# Patient Record
Sex: Male | Born: 1985 | Race: White | Hispanic: No | Marital: Single | Smoking: Never smoker
Health system: Southern US, Community
[De-identification: ages and names within clinical notes are randomized; demographics above are authoritative.]

---
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
## 2010-01-09 ENCOUNTER — Encounter: Admission: RE | Admit: 2010-01-09 | Discharge: 2010-01-09 | Payer: Self-pay | Admitting: Occupational Medicine

## 2014-04-05 ENCOUNTER — Encounter (HOSPITAL_COMMUNITY): Payer: Self-pay | Admitting: Emergency Medicine

## 2014-04-05 ENCOUNTER — Emergency Department (HOSPITAL_COMMUNITY): Payer: Worker's Compensation

## 2014-04-05 ENCOUNTER — Emergency Department (HOSPITAL_COMMUNITY)
Admission: EM | Admit: 2014-04-05 | Discharge: 2014-04-05 | Disposition: A | Discharge status: Home / Self Care | Payer: Worker's Compensation | Attending: Emergency Medicine | Admitting: Emergency Medicine

## 2014-04-05 DIAGNOSIS — Y9389 Activity, other specified: Secondary | ICD-10-CM | POA: Insufficient documentation

## 2014-04-05 DIAGNOSIS — Y9289 Other specified places as the place of occurrence of the external cause: Secondary | ICD-10-CM | POA: Insufficient documentation

## 2014-04-05 DIAGNOSIS — W230XXA Caught, crushed, jammed, or pinched between moving objects, initial encounter: Secondary | ICD-10-CM | POA: Insufficient documentation

## 2014-04-05 DIAGNOSIS — S62309A Unspecified fracture of unspecified metacarpal bone, initial encounter for closed fracture: Secondary | ICD-10-CM | POA: Diagnosis not present

## 2014-04-05 DIAGNOSIS — S62202A Unspecified fracture of first metacarpal bone, left hand, initial encounter for closed fracture: Secondary | ICD-10-CM

## 2014-04-05 DIAGNOSIS — S6990XA Unspecified injury of unspecified wrist, hand and finger(s), initial encounter: Secondary | ICD-10-CM | POA: Insufficient documentation

## 2014-04-05 MED ORDER — HYDROCODONE-ACETAMINOPHEN 5-325 MG PO TABS: 1.0000 | ORAL_TABLET | Freq: Four times a day (QID) | ORAL | Status: DC | PRN

## 2014-04-05 MED ORDER — HYDROCODONE-ACETAMINOPHEN 5-325 MG PO TABS
2.0000 | ORAL_TABLET | Freq: Once | ORAL | Status: AC
  Administered 2014-04-05: 2 via ORAL
  Filled 2014-04-05: qty 2

## 2014-04-05 NOTE — ED Notes (Addendum)
Pt presents to department for evaluation of L hand injury. States he was moving beer kegs and accidentally dropped one on L hand. CMS intact. Capillary refill less than 2 seconds. 3/10 pain upon arrival to ED. No obvious deformities noted.

## 2014-04-05 NOTE — Progress Notes (Signed)
Orthopedic Tech Progress Note Patient Details:  Larry Pierce 1986/02/06 [id number]  Ortho Devices Type of Ortho Device: Ace wrap;Thumb spica splint Ortho Device/Splint Location: lle Ortho Device/Splint Interventions: Application   Myrella Fahs 04/05/2014, 1:35 PM

## 2014-04-05 NOTE — ED Notes (Signed)
Ortho tech at bedside 

## 2014-04-05 NOTE — ED Notes (Signed)
Ortho tech notified.  

## 2014-04-05 NOTE — ED Notes (Signed)
Left hand crushed between two beer kegs. Swelling noted to base left thumb and hand. N-V intact.

## 2014-04-05 NOTE — ED Provider Notes (Signed)
CSN: [id number]     Arrival date & time 04/05/14  1139 History  This chart was scribed for non-physician practitioner, Roxy Horseman, PA-C working with Vanetta Mulders, MD by Greggory Stallion, ED scribe. This patient was seen in room TR05C/TR05C and the patient's care was started at 12:10 PM.     Chief Complaint  Patient presents with  . Hand Pain   The history is provided by the patient. No language interpreter was used.   HPI Comments: Larry Pierce is a 28 y.o. male who presents to the Emergency Department complaining of left hand injury that occurred about one hour ago. States he was moving beer kegs and accidentally smashed his thumb between two of them. Reports sudden onset pain that he rates 2/10 with associated swelling. Certain movements of his hand worsen the pain. Pt has not taken anything for his symptoms but has iced with little relief.   History reviewed. No pertinent past medical history. History reviewed. No pertinent past surgical history. No family history on file. History  Substance Use Topics  . Smoking status: Never Smoker   . Smokeless tobacco: Not on file  . Alcohol Use: Yes     Comment: social    Review of Systems  Constitutional: Negative for fever.  HENT: Negative for congestion.   Eyes: Negative for redness.  Respiratory: Negative for shortness of breath.   Cardiovascular: Negative for chest pain.  Gastrointestinal: Negative for abdominal distention.  Musculoskeletal: Positive for arthralgias and joint swelling.  Skin: Negative for rash.  Neurological: Negative for speech difficulty.  Psychiatric/Behavioral: Negative for confusion.   Allergies  Review of patient's allergies indicates no known allergies.  Home Medications   Prior to Admission medications   Not on File   BP 156/99  Pulse 89  Temp(Src) 98.2 F (36.8 C) (Oral)  Resp 18  SpO2 95%  Physical Exam  Nursing note and vitals reviewed. Constitutional: He is oriented to person,  place, and time. He appears well-developed. No distress.  HENT:  Head: Normocephalic and atraumatic.  Eyes: Conjunctivae and EOM are normal.  Cardiovascular: Normal rate, regular rhythm and intact distal pulses.   Brisk capillary refill.  Pulmonary/Chest: Effort normal. No stridor. No respiratory distress.  Abdominal: He exhibits no distension.  Musculoskeletal: He exhibits tenderness.  Left hand moderately swollen and tender to palpation at the left thumb and MCP. ROM of thumb reduced secondary to pain. No bony abnormality.  Neurological: He is alert and oriented to person, place, and time.  Sensation intact.  Skin: Skin is warm and dry.  Psychiatric: He has a normal mood and affect.    ED Course  Procedures (including critical care time)  DIAGNOSTIC STUDIES: Oxygen Saturation is 95% on RA, adequate by my interpretation.    COORDINATION OF CARE: 12:11 PM-Discussed treatment plan which includes xray with pt at bedside and pt agreed to plan.   Labs Review Labs Reviewed - No data to display  Imaging Review Dg Hand Complete Left  04/05/2014   CLINICAL DATA:  Fall.  EXAM: LEFT HAND - COMPLETE 3+ VIEW  COMPARISON:  None.  FINDINGS: Nondisplaced fractures are noted the base of the left first metacarpal. The fracture may extend into the first carpometacarpal joint space. No other associated abnormalities identified. No displaced fractures identified. No radiopaque foreign body.  IMPRESSION: Nondisplaced fractures at the base of the left first metacarpal. Extension of the fracture to the first carpometacarpal joint space cannot be excluded. a   Nurse, mental health  ByMaisie Fus  Register   On: 04/05/2014 12:29     EKG Interpretation None     SPLINT APPLICATION Date/Time: 4:50 PM Authorized by: Roxy Horseman Consent: Verbal consent obtained. Risks and benefits: risks, benefits and alternatives were discussed Consent given by: patient Splint applied by: orthopedic  technician Location details: left wrist/thumb spica Splint type: thumb spica/volar Supplies used: fiberglass, ace wrap Post-procedure: The splinted body part was neurovascularly unchanged following the procedure. Patient tolerance: Patient tolerated the procedure well with no immediate complications.    MDM   Final diagnoses:  First metacarpal bone fracture, left, closed, initial encounter   Patient with fracture of first metacarpal. Patient placed in thumb spica splint. Recommend hand followup. Patient discussed with Dr. Deretha Emory, who agrees with the plan.  I personally performed the services described in this documentation, which was scribed in my presence. The recorded information has been reviewed and is accurate.  Roxy Horseman, PA-C 04/05/14 1651

## 2014-04-05 NOTE — Discharge Instructions (Signed)
Thumb Fracture  °There are many types of thumb fractures (breaks). There are different ways of treating these fractures, all of which may be correct, varying from case to case. Your caregiver will discuss different ways to treat these fractures with you. °TREATMENT  °· Immobilization. This means the fracture is casted as it is without changing the positions of the fracture (bone pieces) involved. This fracture is casted in a "thumb spica" also called a hitchhiker cast. It is generally left on for 2 to 6 weeks. °· Closed reduction. The bones are manipulated back into position without using surgery. °· ORIF (open reduction and internal fixation). The fracture site is opened and the bone pieces are fixed into place with some type of hardware such as screws or wires. °Your caregiver will discuss the type of fracture you have and the treatment that will be best for that problem. If surgery is the treatment of choice, the following is information for you to know and to let your caregiver know about prior to surgery. °LET YOUR CAREGIVERS KNOW ABOUT: °· Allergies. °· Medications taken including herbs, eye drops, over the counter medications, and creams. °· Use of steroids (by mouth or creams). °· Previous problems with anesthetics or Novocain. °· Family history of anesthetic complications.. °· Possibility of pregnancy, if this applies. °· History of blood clots (thrombophlebitis). °· History of bleeding or blood problems. °· Previous surgery. °· Other health problems. °AFTER THE PROCEDURE  °After surgery, you will be taken to the recovery area. A nurse will watch and check your progress. Once you are awake, stable, and taking fluids well, barring other problems you will be allowed to go home. Once home, an ice pack applied to your operative site may help with discomfort and keep the swelling down. Elevate your hand above your heart as much as possible for the first 4-5 days after the injury/surgery. °HOME CARE INSTRUCTIONS    °· Follow your caregiver's instructions as to activities, exercises, physical therapy, and driving a car. °· Use thumb and exercise as directed. °· Only take over-the-counter or prescription medicines for pain, discomfort, or fever as directed by your caregiver. Do not take aspirin until your caregiver instructs. This can increase bleeding immediately following surgery. °SEEK MEDICAL CARE IF:  °· There is increased bleeding (more than a small spot) from the wound or from beneath your cast or splint. °· There is redness, swelling, or increasing pain in the wound or from beneath your cast or splint. °· You have pus coming from wound or from beneath your cast or splint. °· An unexplained oral temperature above 102° F (38.9° C) develops. °· There is a foul smell coming from the wound or dressing or from beneath your cast or splint. °SEEK IMMEDIATE MEDICAL CARE IF:  °· You develop severe pain, decreased sensation such as numbness or tingling. °· You develop a rash. °· You have difficulty breathing. °· Youhave any allergic problems. °If you do not have a window in your cast for observing the wound, a discharge or minor bleeding may show up as a stain on the outside of your cast. Report these findings to your caregiver. If you have a removable splint overlying the surgical dressings it is common to see a small amount of bleeding. Change the dressings as instructed by your caregiver. °Document Released: 04/12/2003 Document Revised: 10/06/2011 Document Reviewed: 09/16/2013 °Exit[employer]® Patient Information ©2015 [employer]. This information is not intended to replace advice given to you by your health care provider. Make sure   you discuss any questions you have with your health care provider. ° °

## 2014-04-06 NOTE — ED Provider Notes (Signed)
Medical screening examination/treatment/procedure(s) were performed by non-physician practitioner and as supervising physician I was immediately available for consultation/collaboration.   EKG Interpretation None        Thaddeaus Monica, MD 04/06/14 0740 

## 2016-02-16 IMAGING — CR DG HAND COMPLETE 3+V*L*
3 series · 3 of 3 positions shown · non-contrast
Comparison: None.

CLINICAL DATA: Fall.

EXAM:
LEFT HAND - COMPLETE 3+ VIEW

[x hand pa left]
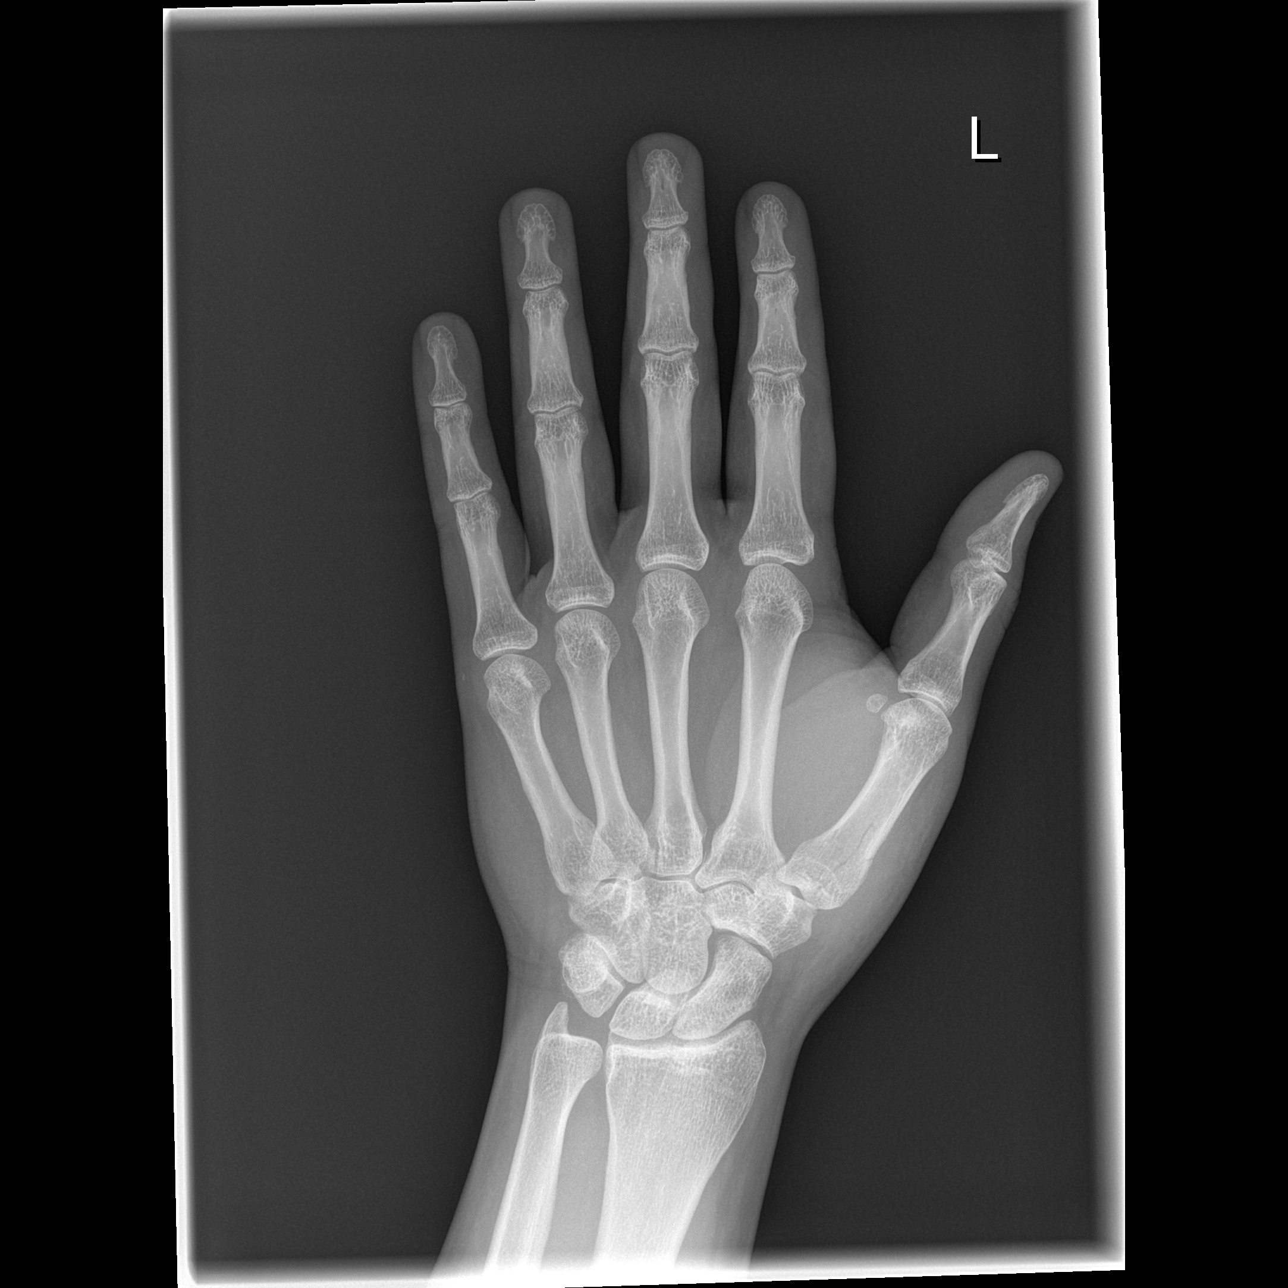

[x hand oblique left]
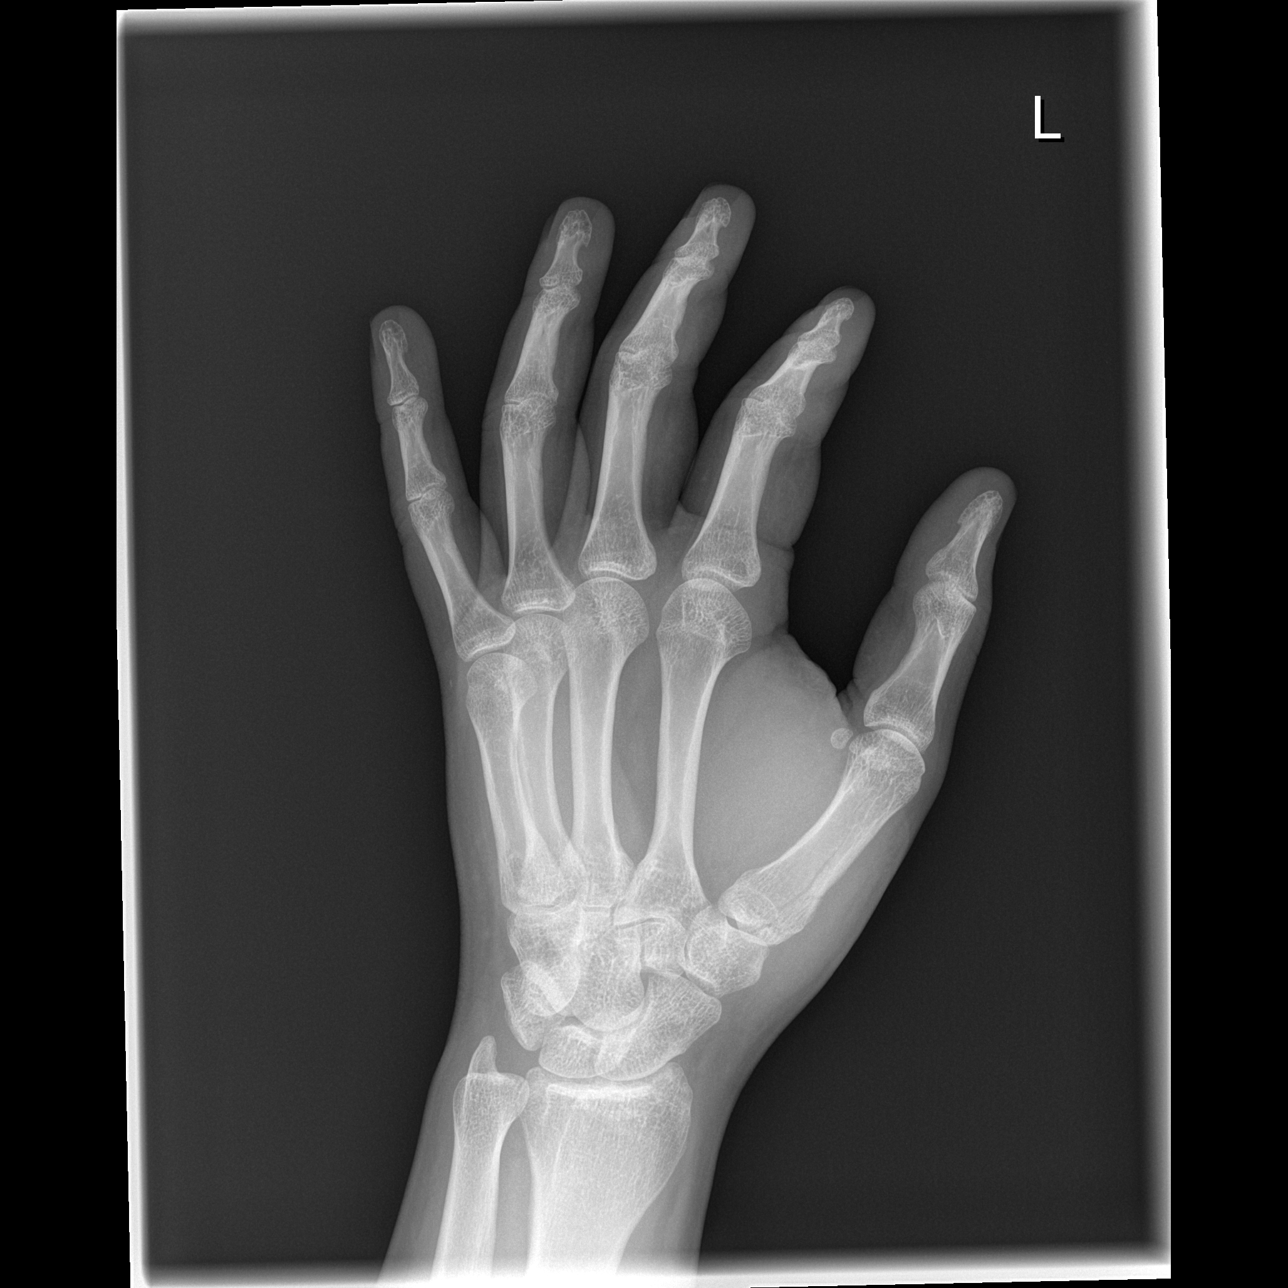

[x hand lat left]
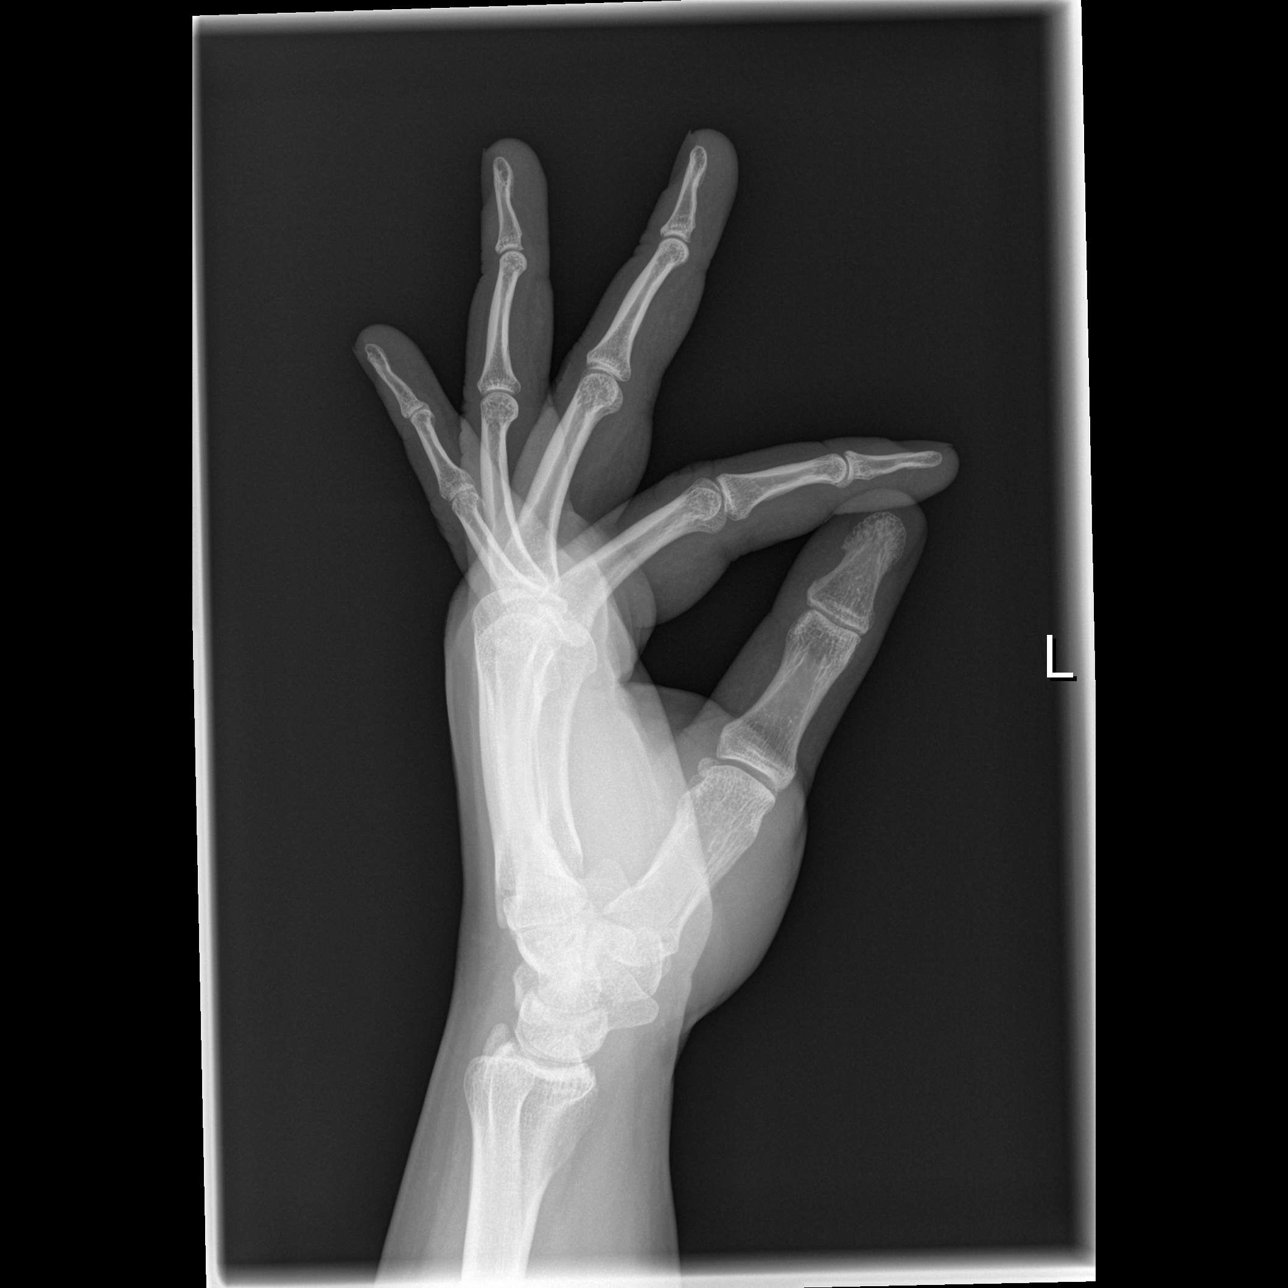

[3 of 3 positions shown; findings below may reference images not displayed]

FINDINGS: Nondisplaced fractures are noted the base of the left first
metacarpal. The fracture may extend into the first carpometacarpal
joint space. No other associated abnormalities identified. No
displaced fractures identified. No radiopaque foreign body.
IMPRESSION: Nondisplaced fractures at the base of the left first metacarpal.
Extension of the fracture to the first carpometacarpal joint space
cannot be excluded. a

## 2017-01-07 ENCOUNTER — Ambulatory Visit: Payer: Self-pay | Admitting: Family Medicine

## 2018-07-29 DIAGNOSIS — Z0000 Encounter for general adult medical examination without abnormal findings: Secondary | ICD-10-CM | POA: Diagnosis not present

## 2018-07-29 DIAGNOSIS — E7800 Pure hypercholesterolemia, unspecified: Secondary | ICD-10-CM | POA: Diagnosis not present

## 2019-03-02 DIAGNOSIS — Z20828 Contact with and (suspected) exposure to other viral communicable diseases: Secondary | ICD-10-CM | POA: Diagnosis not present

## 2019-08-05 DIAGNOSIS — Z0000 Encounter for general adult medical examination without abnormal findings: Secondary | ICD-10-CM | POA: Diagnosis not present

## 2019-08-08 DIAGNOSIS — Z8249 Family history of ischemic heart disease and other diseases of the circulatory system: Secondary | ICD-10-CM | POA: Diagnosis not present

## 2019-08-08 DIAGNOSIS — E7800 Pure hypercholesterolemia, unspecified: Secondary | ICD-10-CM | POA: Diagnosis not present

## 2019-08-08 DIAGNOSIS — Z0000 Encounter for general adult medical examination without abnormal findings: Secondary | ICD-10-CM | POA: Diagnosis not present

## 2019-10-13 ENCOUNTER — Ambulatory Visit: Payer: No Typology Code available for payment source | Attending: Family

## 2019-10-13 DIAGNOSIS — Z23 Encounter for immunization: Secondary | ICD-10-CM

## 2019-10-13 NOTE — Progress Notes (Signed)
   Covid-19 Vaccination Clinic  Name:  Larry Pierce    MRN: [id number] DOB: [birth year]  10/13/2019  Larry Pierce was observed post Covid-19 immunization for 15 minutes without incident. He was provided with Vaccine Information Sheet and instruction to access the V-Safe system.   Larry Pierce was instructed to call 911 with any severe reactions post vaccine: Marland Kitchen Difficulty breathing  . Swelling of face and throat  . A fast heartbeat  . A bad rash all over body  . Dizziness and weakness   Immunizations Administered    Name Date Dose VIS Date Route   Moderna COVID-19 Vaccine 10/13/2019  3:38 PM 0.5 mL 06/28/2019 Intramuscular   Manufacturer: Moderna   Lot: [id number]   NDC: 80223-361-22

## 2019-11-15 ENCOUNTER — Ambulatory Visit: Payer: No Typology Code available for payment source | Attending: Family

## 2019-11-15 DIAGNOSIS — Z23 Encounter for immunization: Secondary | ICD-10-CM

## 2019-11-15 NOTE — Progress Notes (Signed)
   Covid-19 Vaccination Clinic  Name:  Larry Pierce    MRN: [id number] DOB: 11-Oct-1985  11/15/2019  [name] was observed post Covid-19 immunization for 15 minutes without incident. He was provided with Vaccine Information Sheet and instruction to access the V-Safe system.   [name] was instructed to call 911 with any severe reactions post vaccine: Marland Kitchen Difficulty breathing  . Swelling of face and throat  . A fast heartbeat  . A bad rash all over body  . Dizziness and weakness   Immunizations Administered    Name Date Dose VIS Date Route   Moderna COVID-19 Vaccine 11/15/2019  3:42 PM 0.5 mL 06/2019 Intramuscular   Manufacturer: Moderna   Lot: [id number]   NDC: 72158-727-61

## 2021-12-02 ENCOUNTER — Encounter: Payer: Self-pay | Admitting: Emergency Medicine

## 2021-12-02 ENCOUNTER — Ambulatory Visit (INDEPENDENT_AMBULATORY_CARE_PROVIDER_SITE_OTHER): Payer: 59 | Admitting: Emergency Medicine

## 2021-12-02 VITALS — BP 116/84 | HR 103 | Temp 98.4°F | Ht 74.0 in | Wt 280.1 lb

## 2021-12-02 DIAGNOSIS — Z23 Encounter for immunization: Secondary | ICD-10-CM

## 2021-12-02 DIAGNOSIS — Z13220 Encounter for screening for lipoid disorders: Secondary | ICD-10-CM | POA: Diagnosis not present

## 2021-12-02 DIAGNOSIS — Z13228 Encounter for screening for other metabolic disorders: Secondary | ICD-10-CM

## 2021-12-02 DIAGNOSIS — Z1329 Encounter for screening for other suspected endocrine disorder: Secondary | ICD-10-CM

## 2021-12-02 DIAGNOSIS — Z130 Encounter for screening for diseases of the blood and blood-forming organs and certain disorders involving the immune mechanism: Secondary | ICD-10-CM | POA: Diagnosis not present

## 2021-12-02 DIAGNOSIS — Z1159 Encounter for screening for other viral diseases: Secondary | ICD-10-CM | POA: Diagnosis not present

## 2021-12-02 DIAGNOSIS — Z0000 Encounter for general adult medical examination without abnormal findings: Secondary | ICD-10-CM

## 2021-12-02 DIAGNOSIS — R69 Illness, unspecified: Secondary | ICD-10-CM | POA: Diagnosis not present

## 2021-12-02 DIAGNOSIS — Z114 Encounter for screening for human immunodeficiency virus [HIV]: Secondary | ICD-10-CM

## 2021-12-02 LAB — COMPREHENSIVE METABOLIC PANEL
ALT: 29 U/L (ref 0–53)
AST: 21 U/L (ref 0–37)
Albumin: 4.4 g/dL (ref 3.5–5.2)
Alkaline Phosphatase: 84 U/L (ref 39–117)
BUN: 12 mg/dL (ref 6–23)
CO2: 29 mEq/L (ref 19–32)
Calcium: 9.2 mg/dL (ref 8.4–10.5)
Chloride: 105 mEq/L (ref 96–112)
Creatinine, Ser: 1.13 mg/dL (ref 0.40–1.50)
GFR: 83.75 mL/min (ref >60.00)
Glucose, Bld: 100 mg/dL — ABNORMAL HIGH (ref 70–99)
Potassium: 4.3 mEq/L (ref 3.5–5.1)
Sodium: 143 mEq/L (ref 135–145)
Total Bilirubin: 0.4 mg/dL (ref 0.2–1.2)
Total Protein: 7.3 g/dL (ref 6.0–8.3)

## 2021-12-02 LAB — HEMOGLOBIN A1C: Hgb A1c MFr Bld: 5.7 % (ref 4.6–6.5)

## 2021-12-02 LAB — LIPID PANEL
Cholesterol: 181 mg/dL (ref 0–200)
HDL: 39.9 mg/dL (ref >39.00)
LDL Cholesterol: 108 mg/dL — ABNORMAL HIGH (ref 0–99)
NonHDL: 141.3
Total CHOL/HDL Ratio: 5
Triglycerides: 169 mg/dL — ABNORMAL HIGH (ref 0.0–149.0)
VLDL: 33.8 mg/dL (ref 0.0–40.0)

## 2021-12-02 LAB — CBC WITH DIFFERENTIAL/PLATELET
Basophils Absolute: 0.1 10*3/uL (ref 0.0–0.1)
Basophils Relative: 0.6 % (ref 0.0–3.0)
Eosinophils Absolute: 0.1 10*3/uL (ref 0.0–0.7)
Eosinophils Relative: 1.5 % (ref 0.0–5.0)
HCT: 44.1 % (ref 39.0–52.0)
Hemoglobin: 14.8 g/dL (ref 13.0–17.0)
Lymphocytes Relative: 23.5 % (ref 12.0–46.0)
Lymphs Abs: 2.1 10*3/uL (ref 0.7–4.0)
MCHC: 33.7 g/dL (ref 30.0–36.0)
MCV: 86.8 fl (ref 78.0–100.0)
Monocytes Absolute: 0.7 10*3/uL (ref 0.1–1.0)
Monocytes Relative: 7.4 % (ref 3.0–12.0)
Neutro Abs: 6.1 10*3/uL (ref 1.4–7.7)
Neutrophils Relative %: 67 % (ref 43.0–77.0)
Platelets: 245 10*3/uL (ref 150.0–400.0)
RBC: 5.07 Mil/uL (ref 4.22–5.81)
RDW: 14.2 % (ref 11.5–15.5)
WBC: 9.1 10*3/uL (ref 4.0–10.5)

## 2021-12-02 NOTE — Patient Instructions (Signed)
Health Maintenance, Male Adopting a healthy lifestyle and getting preventive care are important in promoting health and wellness. Ask your health care provider about: The right schedule for you to have regular tests and exams. Things you can do on your own to prevent diseases and keep yourself healthy. What should I know about diet, weight, and exercise? Eat a healthy diet  Eat a diet that includes plenty of vegetables, fruits, low-fat dairy products, and lean protein. Do not eat a lot of foods that are high in solid fats, added sugars, or sodium. Maintain a healthy weight Body mass index (BMI) is a measurement that can be used to identify possible weight problems. It estimates body fat based on height and weight. Your health care provider can help determine your BMI and help you achieve or maintain a healthy weight. Get regular exercise Get regular exercise. This is one of the most important things you can do for your health. Most adults should: Exercise for at least 150 minutes each week. The exercise should increase your heart rate and make you sweat (moderate-intensity exercise). Do strengthening exercises at least twice a week. This is in addition to the moderate-intensity exercise. Spend less time sitting. Even light physical activity can be beneficial. Watch cholesterol and blood lipids Have your blood tested for lipids and cholesterol at 36 years of age, then have this test every 5 years. You may need to have your cholesterol levels checked more often if: Your lipid or cholesterol levels are high. You are older than 36 years of age. You are at high risk for heart disease. What should I know about cancer screening? Many types of cancers can be detected early and may often be prevented. Depending on your health history and family history, you may need to have cancer screening at various ages. This may include screening for: Colorectal cancer. Prostate cancer. Skin cancer. Lung  cancer. What should I know about heart disease, diabetes, and high blood pressure? Blood pressure and heart disease High blood pressure causes heart disease and increases the risk of stroke. This is more likely to develop in people who have high blood pressure readings or are overweight. Talk with your health care provider about your target blood pressure readings. Have your blood pressure checked: Every 3-5 years if you are 18-39 years of age. Every year if you are 40 years old or older. If you are between the ages of 65 and 75 and are a current or former smoker, ask your health care provider if you should have a one-time screening for abdominal aortic aneurysm (AAA). Diabetes Have regular diabetes screenings. This checks your fasting blood sugar level. Have the screening done: Once every three years after age 45 if you are at a normal weight and have a low risk for diabetes. More often and at a younger age if you are overweight or have a high risk for diabetes. What should I know about preventing infection? Hepatitis B If you have a higher risk for hepatitis B, you should be screened for this virus. Talk with your health care provider to find out if you are at risk for hepatitis B infection. Hepatitis C Blood testing is recommended for: Everyone born from 1945 through 1965. Anyone with known risk factors for hepatitis C. Sexually transmitted infections (STIs) You should be screened each year for STIs, including gonorrhea and chlamydia, if: You are sexually active and are younger than 36 years of age. You are older than 36 years of age and your   health care provider tells you that you are at risk for this type of infection. Your sexual activity has changed since you were last screened, and you are at increased risk for chlamydia or gonorrhea. Ask your health care provider if you are at risk. Ask your health care provider about whether you are at high risk for HIV. Your health care provider  may recommend a prescription medicine to help prevent HIV infection. If you choose to take medicine to prevent HIV, you should first get tested for HIV. You should then be tested every 3 months for as long as you are taking the medicine. Follow these instructions at home: Alcohol use Do not drink alcohol if your health care provider tells you not to drink. If you drink alcohol: Limit how much you have to 0-2 drinks a day. Know how much alcohol is in your drink. In the [place] one drink equals one 12 oz bottle of beer (355 mL), one 5 oz glass of wine (148 mL), or one 1 oz glass of hard liquor (44 mL). Lifestyle Do not use any products that contain nicotine or tobacco. These products include cigarettes, chewing tobacco, and vaping devices, such as e-cigarettes. If you need help quitting, ask your health care provider. Do not use street drugs. Do not share needles. Ask your health care provider for help if you need support or information about quitting drugs. General instructions Schedule regular health, dental, and eye exams. Stay current with your vaccines. Tell your health care provider if: You often feel depressed. You have ever been abused or do not feel safe at home. Summary Adopting a healthy lifestyle and getting preventive care are important in promoting health and wellness. Follow your health care provider's instructions about healthy diet, exercising, and getting tested or screened for diseases. Follow your health care provider's instructions on monitoring your cholesterol and blood pressure. This information is not intended to replace advice given to you by your health care provider. Make sure you discuss any questions you have with your health care provider. Document Revised: 12/03/2020 Document Reviewed: 12/03/2020 Elsevier Patient Education  2023 [employer]  

## 2021-12-02 NOTE — Progress Notes (Signed)
Larry Pierce 36 y.o.   Chief Complaint  Patient presents with   New Patient (Initial Visit)    No concerns    HISTORY OF PRESENT ILLNESS: This is a 36 y.o. male first visit to this office, here to establish care. Healthy male with a healthy lifestyle. Has no complaints or medical concerns today.  HPI   Prior to Admission medications   Not on File    No Known Allergies  There are no problems to display for this patient.   No past medical history on file.  No past surgical history on file.  Social History   Socioeconomic History   Marital status: Single    Spouse name: Not on file   Number of children: Not on file   Years of education: Not on file   Highest education level: Not on file  Occupational History   Not on file  Tobacco Use   Smoking status: Never   Smokeless tobacco: Not on file  Substance and Sexual Activity   Alcohol use: Yes    Comment: social   Drug use: No   Sexual activity: Not on file  Other Topics Concern   Not on file  Social History Narrative   Not on file   Social Determinants of Health   Financial Resource Strain: Not on file  Food Insecurity: Not on file  Transportation Needs: Not on file  Physical Activity: Not on file  Stress: Not on file  Social Connections: Not on file  Intimate Partner Violence: Not on file    No family history on file.   Review of Systems  Constitutional: Negative.  Negative for chills and fever.  HENT: Negative.  Negative for congestion and sore throat.   Respiratory: Negative.  Negative for cough and shortness of breath.   Cardiovascular: Negative.  Negative for chest pain and palpitations.  Gastrointestinal:  Negative for abdominal pain, diarrhea, nausea and vomiting.  Musculoskeletal:  Positive for joint pain (Occasional knee pains).  Skin: Negative.  Negative for rash.  Neurological: Negative.  Negative for dizziness and headaches.  All other systems reviewed and are  negative.  Today's Vitals   12/02/21 1421  BP: 116/84  Pulse: (!) 103  Temp: 98.4 F (36.9 C)  TempSrc: Oral  SpO2: 96%  Weight: 280 lb 2 oz (127.1 kg)  Height: 6\' 2"  (1.88 m)   Body mass index is 35.97 kg/m.  Physical Exam Vitals reviewed.  Constitutional:      Appearance: Normal appearance.  HENT:     Head: Normocephalic.     Right Ear: Tympanic membrane, ear canal and external ear normal.     Left Ear: Tympanic membrane, ear canal and external ear normal.     Mouth/Throat:     Mouth: Mucous membranes are moist.     Pharynx: Oropharynx is clear.  Eyes:     Extraocular Movements: Extraocular movements intact.     Conjunctiva/sclera: Conjunctivae normal.     Pupils: Pupils are equal, round, and reactive to light.  Cardiovascular:     Rate and Rhythm: Normal rate and regular rhythm.     Pulses: Normal pulses.     Heart sounds: Normal heart sounds.  Pulmonary:     Effort: Pulmonary effort is normal.     Breath sounds: Normal breath sounds.  Abdominal:     General: There is no distension.     Palpations: Abdomen is soft.     Tenderness: There is no abdominal tenderness.  Musculoskeletal:  General: Normal range of motion.     Cervical back: No tenderness.  Lymphadenopathy:     Cervical: No cervical adenopathy.  Skin:    General: Skin is warm and dry.     Capillary Refill: Capillary refill takes less than 2 seconds.  Neurological:     General: No focal deficit present.     Mental Status: He is alert and oriented to person, place, and time.  Psychiatric:        Mood and Affect: Mood normal.        Behavior: Behavior normal.    ASSESSMENT & PLAN: Problem List Items Addressed This Visit   None Visit Diagnoses     Routine general medical examination at a health care facility    -  Primary   Need for vaccination       Relevant Orders   Tdap vaccine greater than or equal to 7yo IM (Completed)   Need for hepatitis C screening test       Relevant Orders    Hepatitis C antibody screen   Screening for HIV (human immunodeficiency virus)       Relevant Orders   HIV antibody   Screening for deficiency anemia       Relevant Orders   CBC with Differential   Screening for lipoid disorders       Relevant Orders   Lipid panel   Screening for endocrine, metabolic and immunity disorder       Relevant Orders   Comprehensive metabolic panel   Hemoglobin A1c      Modifiable risk factors discussed with patient. Anticipatory guidance according to age provided. The following topics were also discussed: Social Determinants of Health Smoking.  Non-smoker Diet and nutrition and need to decrease amount of daily carbohydrate intake Benefits of exercise Cancer family history review Vaccinations recommendations Cardiovascular risk assessment and need for blood work Mental health including depression and anxiety Fall and accident prevention  Patient Instructions  Health Maintenance, Male Adopting a healthy lifestyle and getting preventive care are important in promoting health and wellness. Ask your health care provider about: The right schedule for you to have regular tests and exams. Things you can do on your own to prevent diseases and keep yourself healthy. What should I know about diet, weight, and exercise? Eat a healthy diet  Eat a diet that includes plenty of vegetables, fruits, low-fat dairy products, and lean protein. Do not eat a lot of foods that are high in solid fats, added sugars, or sodium. Maintain a healthy weight Body mass index (BMI) is a measurement that can be used to identify possible weight problems. It estimates body fat based on height and weight. Your health care provider can help determine your BMI and help you achieve or maintain a healthy weight. Get regular exercise Get regular exercise. This is one of the most important things you can do for your health. Most adults should: Exercise for at least 150 minutes each week.  The exercise should increase your heart rate and make you sweat (moderate-intensity exercise). Do strengthening exercises at least twice a week. This is in addition to the moderate-intensity exercise. Spend less time sitting. Even light physical activity can be beneficial. Watch cholesterol and blood lipids Have your blood tested for lipids and cholesterol at 36 years of age, then have this test every 5 years. You may need to have your cholesterol levels checked more often if: Your lipid or cholesterol levels are high. You are older than  35 years of age. You are at high risk for heart disease. What should I know about cancer screening? Many types of cancers can be detected early and may often be prevented. Depending on your health history and family history, you may need to have cancer screening at various ages. This may include screening for: Colorectal cancer. Prostate cancer. Skin cancer. Lung cancer. What should I know about heart disease, diabetes, and high blood pressure? Blood pressure and heart disease High blood pressure causes heart disease and increases the risk of stroke. This is more likely to develop in people who have high blood pressure readings or are overweight. Talk with your health care provider about your target blood pressure readings. Have your blood pressure checked: Every 3-5 years if you are 71-37 years of age. Every year if you are 38 years old or older. If you are between the ages of 42 and 101 and are a current or former smoker, ask your health care provider if you should have a one-time screening for abdominal aortic aneurysm (AAA). Diabetes Have regular diabetes screenings. This checks your fasting blood sugar level. Have the screening done: Once every three years after age 1 if you are at a normal weight and have a low risk for diabetes. More often and at a younger age if you are overweight or have a high risk for diabetes. What should I know about  preventing infection? Hepatitis B If you have a higher risk for hepatitis B, you should be screened for this virus. Talk with your health care provider to find out if you are at risk for hepatitis B infection. Hepatitis C Blood testing is recommended for: Everyone born from [birth year] through 1965. Anyone with known risk factors for hepatitis C. Sexually transmitted infections (STIs) You should be screened each year for STIs, including gonorrhea and chlamydia, if: You are sexually active and are younger than 36 years of age. You are older than 36 years of age and your health care provider tells you that you are at risk for this type of infection. Your sexual activity has changed since you were last screened, and you are at increased risk for chlamydia or gonorrhea. Ask your health care provider if you are at risk. Ask your health care provider about whether you are at high risk for HIV. Your health care provider may recommend a prescription medicine to help prevent HIV infection. If you choose to take medicine to prevent HIV, you should first get tested for HIV. You should then be tested every 3 months for as long as you are taking the medicine. Follow these instructions at home: Alcohol use Do not drink alcohol if your health care provider tells you not to drink. If you drink alcohol: Limit how much you have to 0-2 drinks a day. Know how much alcohol is in your drink. In the [place] one drink equals one 12 oz bottle of beer (355 mL), one 5 oz glass of wine (148 mL), or one 1 oz glass of hard liquor (44 mL). Lifestyle Do not use any products that contain nicotine or tobacco. These products include cigarettes, chewing tobacco, and vaping devices, such as e-cigarettes. If you need help quitting, ask your health care provider. Do not use street drugs. Do not share needles. Ask your health care provider for help if you need support or information about quitting drugs. General instructions Schedule  regular health, dental, and eye exams. Stay current with your vaccines. Tell your health care provider if: You  often feel depressed. You have ever been abused or do not feel safe at home. Summary Adopting a healthy lifestyle and getting preventive care are important in promoting health and wellness. Follow your health care provider's instructions about healthy diet, exercising, and getting tested or screened for diseases. Follow your health care provider's instructions on monitoring your cholesterol and blood pressure. This information is not intended to replace advice given to you by your health care provider. Make sure you discuss any questions you have with your health care provider. Document Revised: 12/03/2020 Document Reviewed: 12/03/2020 Elsevier Patient Education  2023 [employer]     Edwina Barth, MD [facility]

## 2021-12-03 LAB — HIV ANTIBODY (ROUTINE TESTING W REFLEX): HIV 1&2 Ab, 4th Generation: NONREACTIVE

## 2021-12-03 LAB — HEPATITIS C ANTIBODY
Hepatitis C Ab: NONREACTIVE
SIGNAL TO CUT-OFF: 0.13 (ref <1.00)

## 2024-03-31 ENCOUNTER — Ambulatory Visit: Admitting: Emergency Medicine

## 2024-03-31 ENCOUNTER — Encounter: Payer: Self-pay | Admitting: Emergency Medicine

## 2024-03-31 ENCOUNTER — Ambulatory Visit: Payer: Self-pay | Admitting: Emergency Medicine

## 2024-03-31 VITALS — BP 132/92 | HR 94 | Temp 98.4°F | Ht 74.0 in | Wt 281.0 lb

## 2024-03-31 DIAGNOSIS — Z1329 Encounter for screening for other suspected endocrine disorder: Secondary | ICD-10-CM

## 2024-03-31 DIAGNOSIS — Z130 Encounter for screening for diseases of the blood and blood-forming organs and certain disorders involving the immune mechanism: Secondary | ICD-10-CM

## 2024-03-31 DIAGNOSIS — Z13220 Encounter for screening for lipoid disorders: Secondary | ICD-10-CM

## 2024-03-31 DIAGNOSIS — Z0000 Encounter for general adult medical examination without abnormal findings: Secondary | ICD-10-CM

## 2024-03-31 DIAGNOSIS — Z13228 Encounter for screening for other metabolic disorders: Secondary | ICD-10-CM | POA: Diagnosis not present

## 2024-03-31 LAB — COMPREHENSIVE METABOLIC PANEL WITH GFR
ALT: 31 U/L (ref 0–53)
AST: 25 U/L (ref 0–37)
Albumin: 4.2 g/dL (ref 3.5–5.2)
Alkaline Phosphatase: 72 U/L (ref 39–117)
BUN: 9 mg/dL (ref 6–23)
CO2: 30 meq/L (ref 19–32)
Calcium: 9 mg/dL (ref 8.4–10.5)
Chloride: 102 meq/L (ref 96–112)
Creatinine, Ser: 1.11 mg/dL (ref 0.40–1.50)
GFR: 84.18 mL/min (ref >60.00)
Glucose, Bld: 86 mg/dL (ref 70–99)
Potassium: 3.8 meq/L (ref 3.5–5.1)
Sodium: 141 meq/L (ref 135–145)
Total Bilirubin: 0.4 mg/dL (ref 0.2–1.2)
Total Protein: 7.2 g/dL (ref 6.0–8.3)

## 2024-03-31 LAB — LIPID PANEL
Cholesterol: 207 mg/dL — ABNORMAL HIGH (ref 0–200)
HDL: 41.6 mg/dL (ref >39.00)
LDL Cholesterol: 134 mg/dL — ABNORMAL HIGH (ref 0–99)
NonHDL: 165.34
Total CHOL/HDL Ratio: 5
Triglycerides: 155 mg/dL — ABNORMAL HIGH (ref 0.0–149.0)
VLDL: 31 mg/dL (ref 0.0–40.0)

## 2024-03-31 LAB — CBC WITH DIFFERENTIAL/PLATELET
Basophils Absolute: 0 K/uL (ref 0.0–0.1)
Basophils Relative: 0.3 % (ref 0.0–3.0)
Eosinophils Absolute: 0.1 K/uL (ref 0.0–0.7)
Eosinophils Relative: 1.3 % (ref 0.0–5.0)
HCT: 44.7 % (ref 39.0–52.0)
Hemoglobin: 15.2 g/dL (ref 13.0–17.0)
Lymphocytes Relative: 32.4 % (ref 12.0–46.0)
Lymphs Abs: 2.2 K/uL (ref 0.7–4.0)
MCHC: 33.9 g/dL (ref 30.0–36.0)
MCV: 85.3 fl (ref 78.0–100.0)
Monocytes Absolute: 0.6 K/uL (ref 0.1–1.0)
Monocytes Relative: 8.1 % (ref 3.0–12.0)
Neutro Abs: 4 K/uL (ref 1.4–7.7)
Neutrophils Relative %: 57.9 % (ref 43.0–77.0)
Platelets: 212 K/uL (ref 150.0–400.0)
RBC: 5.24 Mil/uL (ref 4.22–5.81)
RDW: 14.4 % (ref 11.5–15.5)
WBC: 6.9 K/uL (ref 4.0–10.5)

## 2024-03-31 LAB — HEMOGLOBIN A1C: Hgb A1c MFr Bld: 6.1 % (ref 4.6–6.5)

## 2024-03-31 NOTE — Patient Instructions (Signed)
 Health Maintenance, Male  Adopting a healthy lifestyle and getting preventive care are important in promoting health and wellness. Ask your health care provider about:  The right schedule for you to have regular tests and exams.  Things you can do on your own to prevent diseases and keep yourself healthy.  What should I know about diet, weight, and exercise?  Eat a healthy diet    Eat a diet that includes plenty of vegetables, fruits, low-fat dairy products, and lean protein.  Do not eat a lot of foods that are high in solid fats, added sugars, or sodium.  Maintain a healthy weight  Body mass index (BMI) is a measurement that can be used to identify possible weight problems. It estimates body fat based on height and weight. Your health care provider can help determine your BMI and help you achieve or maintain a healthy weight.  Get regular exercise  Get regular exercise. This is one of the most important things you can do for your health. Most adults should:  Exercise for at least 150 minutes each week. The exercise should increase your heart rate and make you sweat (moderate-intensity exercise).  Do strengthening exercises at least twice a week. This is in addition to the moderate-intensity exercise.  Spend less time sitting. Even light physical activity can be beneficial.  Watch cholesterol and blood lipids  Have your blood tested for lipids and cholesterol at 38 years of age, then have this test every 5 years.  You may need to have your cholesterol levels checked more often if:  Your lipid or cholesterol levels are high.  You are older than 38 years of age.  You are at high risk for heart disease.  What should I know about cancer screening?  Many types of cancers can be detected early and may often be prevented. Depending on your health history and family history, you may need to have cancer screening at various ages. This may include screening for:  Colorectal cancer.  Prostate cancer.  Skin cancer.  Lung  cancer.  What should I know about heart disease, diabetes, and high blood pressure?  Blood pressure and heart disease  High blood pressure causes heart disease and increases the risk of stroke. This is more likely to develop in people who have high blood pressure readings or are overweight.  Talk with your health care provider about your target blood pressure readings.  Have your blood pressure checked:  Every 3-5 years if you are 24-52 years of age.  Every year if you are 3 years old or older.  If you are between the ages of 60 and 72 and are a current or former smoker, ask your health care provider if you should have a one-time screening for abdominal aortic aneurysm (AAA).  Diabetes  Have regular diabetes screenings. This checks your fasting blood sugar level. Have the screening done:  Once every three years after age 66 if you are at a normal weight and have a low risk for diabetes.  More often and at a younger age if you are overweight or have a high risk for diabetes.  What should I know about preventing infection?  Hepatitis B  If you have a higher risk for hepatitis B, you should be screened for this virus. Talk with your health care provider to find out if you are at risk for hepatitis B infection.  Hepatitis C  Blood testing is recommended for:  Everyone born from [birth year] through 1965.  Anyone  with known risk factors for hepatitis C.  Sexually transmitted infections (STIs)  You should be screened each year for STIs, including gonorrhea and chlamydia, if:  You are sexually active and are younger than 38 years of age.  You are older than 38 years of age and your health care provider tells you that you are at risk for this type of infection.  Your sexual activity has changed since you were last screened, and you are at increased risk for chlamydia or gonorrhea. Ask your health care provider if you are at risk.  Ask your health care provider about whether you are at high risk for HIV. Your health care provider  may recommend a prescription medicine to help prevent HIV infection. If you choose to take medicine to prevent HIV, you should first get tested for HIV. You should then be tested every 3 months for as long as you are taking the medicine.  Follow these instructions at home:  Alcohol use  Do not drink alcohol if your health care provider tells you not to drink.  If you drink alcohol:  Limit how much you have to 0-2 drinks a day.  Know how much alcohol is in your drink. In the [place] one drink equals one 12 oz bottle of beer (355 mL), one 5 oz glass of wine (148 mL), or one 1 oz glass of hard liquor (44 mL).  Lifestyle  Do not use any products that contain nicotine or tobacco. These products include cigarettes, chewing tobacco, and vaping devices, such as e-cigarettes. If you need help quitting, ask your health care provider.  Do not use street drugs.  Do not share needles.  Ask your health care provider for help if you need support or information about quitting drugs.  General instructions  Schedule regular health, dental, and eye exams.  Stay current with your vaccines.  Tell your health care provider if:  You often feel depressed.  You have ever been abused or do not feel safe at home.  Summary  Adopting a healthy lifestyle and getting preventive care are important in promoting health and wellness.  Follow your health care provider's instructions about healthy diet, exercising, and getting tested or screened for diseases.  Follow your health care provider's instructions on monitoring your cholesterol and blood pressure.  This information is not intended to replace advice given to you by your health care provider. Make sure you discuss any questions you have with your health care provider.  Document Revised: 12/03/2020 Document Reviewed: 12/03/2020  Elsevier Patient Education  2024 [employer].

## 2024-03-31 NOTE — Progress Notes (Signed)
 Larry Pierce 38 y.o.   Chief Complaint  Patient presents with   Annual Exam    Patient here for physical     HISTORY OF PRESENT ILLNESS: This is a 38 y.o. male here for annual exam. Healthy male with healthy lifestyle. Has no complaints or medical concerns today.  HPI   Prior to Admission medications   Not on File    No Known Allergies  There are no active problems to display for this patient.   No past medical history on file.  No past surgical history on file.  Social History   Socioeconomic History   Marital status: Single    Spouse name: Not on file   Number of children: Not on file   Years of education: Not on file   Highest education level: Not on file  Occupational History   Not on file  Tobacco Use   Smoking status: Never   Smokeless tobacco: Not on file  Substance and Sexual Activity   Alcohol use: Yes    Comment: social   Drug use: No   Sexual activity: Not on file  Other Topics Concern   Not on file  Social History Narrative   Not on file   Social Drivers of Health   Financial Resource Strain: Not on file  Food Insecurity: Not on file  Transportation Needs: Not on file  Physical Activity: Not on file  Stress: Not on file  Social Connections: Not on file  Intimate Partner Violence: Not on file    No family history on file.   Review of Systems  Constitutional: Negative.  Negative for chills and fever.  HENT: Negative.  Negative for congestion and sore throat.   Respiratory: Negative.  Negative for cough and shortness of breath.   Cardiovascular: Negative.  Negative for chest pain and palpitations.  Gastrointestinal:  Negative for abdominal pain, diarrhea, nausea and vomiting.  Genitourinary: Negative.  Negative for dysuria and hematuria.  Skin: Negative.  Negative for rash.  Neurological: Negative.  Negative for dizziness and headaches.  All other systems reviewed and are negative.   Today's Vitals   03/31/24 1254  BP:  (!) 132/92  Pulse: 94  Temp: 98.4 F (36.9 C)  TempSrc: Oral  SpO2: 98%  Weight: 281 lb (127.5 kg)  Height: 6' 2 (1.88 m)   Body mass index is 36.08 kg/m.   Physical Exam Vitals reviewed.  Constitutional:      Appearance: Normal appearance.  HENT:     Head: Normocephalic.     Right Ear: Tympanic membrane, ear canal and external ear normal.     Left Ear: Tympanic membrane, ear canal and external ear normal.     Mouth/Throat:     Mouth: Mucous membranes are moist.     Pharynx: Oropharynx is clear.  Eyes:     Extraocular Movements: Extraocular movements intact.     Pupils: Pupils are equal, round, and reactive to light.  Cardiovascular:     Rate and Rhythm: Normal rate and regular rhythm.     Pulses: Normal pulses.     Heart sounds: Normal heart sounds.  Pulmonary:     Effort: Pulmonary effort is normal.     Breath sounds: Normal breath sounds.  Abdominal:     Palpations: Abdomen is soft.     Tenderness: There is no abdominal tenderness.  Musculoskeletal:     Cervical back: No tenderness.  Lymphadenopathy:     Cervical: No cervical adenopathy.  Skin:  General: Skin is warm and dry.     Capillary Refill: Capillary refill takes less than 2 seconds.  Neurological:     General: No focal deficit present.     Mental Status: He is alert and oriented to person, place, and time.  Psychiatric:        Mood and Affect: Mood normal.        Behavior: Behavior normal.      ASSESSMENT & PLAN: Problem List Items Addressed This Visit   None Visit Diagnoses       Routine general medical examination at a health care facility    -  Primary   Relevant Orders   CBC with Differential/Platelet   Comprehensive metabolic panel with GFR   Hemoglobin A1c   Lipid panel     Screening for deficiency anemia       Relevant Orders   CBC with Differential/Platelet     Screening for lipoid disorders       Relevant Orders   Lipid panel     Screening for endocrine, metabolic and  immunity disorder       Relevant Orders   Comprehensive metabolic panel with GFR   Hemoglobin A1c      Modifiable risk factors discussed with patient. Anticipatory guidance according to age provided. The following topics were also discussed: Social Determinants of Health Smoking.  Non-smoker Diet and nutrition Benefits of exercise Cancer family history review Vaccinations reviewed and recommendations Cardiovascular risk assessment and need for blood work Mental health including depression and anxiety Fall and accident prevention  Patient Instructions  Health Maintenance, Male Adopting a healthy lifestyle and getting preventive care are important in promoting health and wellness. Ask your health care provider about: The right schedule for you to have regular tests and exams. Things you can do on your own to prevent diseases and keep yourself healthy. What should I know about diet, weight, and exercise? Eat a healthy diet  Eat a diet that includes plenty of vegetables, fruits, low-fat dairy products, and lean protein. Do not eat a lot of foods that are high in solid fats, added sugars, or sodium. Maintain a healthy weight Body mass index (BMI) is a measurement that can be used to identify possible weight problems. It estimates body fat based on height and weight. Your health care provider can help determine your BMI and help you achieve or maintain a healthy weight. Get regular exercise Get regular exercise. This is one of the most important things you can do for your health. Most adults should: Exercise for at least 150 minutes each week. The exercise should increase your heart rate and make you sweat (moderate-intensity exercise). Do strengthening exercises at least twice a week. This is in addition to the moderate-intensity exercise. Spend less time sitting. Even light physical activity can be beneficial. Watch cholesterol and blood lipids Have your blood tested for lipids and  cholesterol at 38 years of age, then have this test every 5 years. You may need to have your cholesterol levels checked more often if: Your lipid or cholesterol levels are high. You are older than 38 years of age. You are at high risk for heart disease. What should I know about cancer screening? Many types of cancers can be detected early and may often be prevented. Depending on your health history and family history, you may need to have cancer screening at various ages. This may include screening for: Colorectal cancer. Prostate cancer. Skin cancer. Lung cancer. What should I  know about heart disease, diabetes, and high blood pressure? Blood pressure and heart disease High blood pressure causes heart disease and increases the risk of stroke. This is more likely to develop in people who have high blood pressure readings or are overweight. Talk with your health care provider about your target blood pressure readings. Have your blood pressure checked: Every 3-5 years if you are 22-35 years of age. Every year if you are 61 years old or older. If you are between the ages of 57 and 45 and are a current or former smoker, ask your health care provider if you should have a one-time screening for abdominal aortic aneurysm (AAA). Diabetes Have regular diabetes screenings. This checks your fasting blood sugar level. Have the screening done: Once every three years after age 52 if you are at a normal weight and have a low risk for diabetes. More often and at a younger age if you are overweight or have a high risk for diabetes. What should I know about preventing infection? Hepatitis B If you have a higher risk for hepatitis B, you should be screened for this virus. Talk with your health care provider to find out if you are at risk for hepatitis B infection. Hepatitis C Blood testing is recommended for: Everyone born from [birth year] through 1965. Anyone with known risk factors for hepatitis C. Sexually  transmitted infections (STIs) You should be screened each year for STIs, including gonorrhea and chlamydia, if: You are sexually active and are younger than 38 years of age. You are older than 38 years of age and your health care provider tells you that you are at risk for this type of infection. Your sexual activity has changed since you were last screened, and you are at increased risk for chlamydia or gonorrhea. Ask your health care provider if you are at risk. Ask your health care provider about whether you are at high risk for HIV. Your health care provider may recommend a prescription medicine to help prevent HIV infection. If you choose to take medicine to prevent HIV, you should first get tested for HIV. You should then be tested every 3 months for as long as you are taking the medicine. Follow these instructions at home: Alcohol use Do not drink alcohol if your health care provider tells you not to drink. If you drink alcohol: Limit how much you have to 0-2 drinks a day. Know how much alcohol is in your drink. In the [place] one drink equals one 12 oz bottle of beer (355 mL), one 5 oz glass of wine (148 mL), or one 1 oz glass of hard liquor (44 mL). Lifestyle Do not use any products that contain nicotine or tobacco. These products include cigarettes, chewing tobacco, and vaping devices, such as e-cigarettes. If you need help quitting, ask your health care provider. Do not use street drugs. Do not share needles. Ask your health care provider for help if you need support or information about quitting drugs. General instructions Schedule regular health, dental, and eye exams. Stay current with your vaccines. Tell your health care provider if: You often feel depressed. You have ever been abused or do not feel safe at home. Summary Adopting a healthy lifestyle and getting preventive care are important in promoting health and wellness. Follow your health care provider's instructions about  healthy diet, exercising, and getting tested or screened for diseases. Follow your health care provider's instructions on monitoring your cholesterol and blood pressure. This information is not intended  to replace advice given to you by your health care provider. Make sure you discuss any questions you have with your health care provider. Document Revised: 12/03/2020 Document Reviewed: 12/03/2020 [employer] Patient Education  2024 [employer]   Emil Schaumann, MD [facility]
